# Patient Record
Sex: Female | Born: 1985 | Race: White | Hispanic: No | Smoking: Never smoker
Health system: Southern US, Community
[De-identification: ages and names within clinical notes are randomized; demographics above are authoritative.]

## PROBLEM LIST (undated history)

## (undated) DIAGNOSIS — A0472 Enterocolitis due to Clostridium difficile, not specified as recurrent: Secondary | ICD-10-CM

## (undated) DIAGNOSIS — Z22322 Carrier or suspected carrier of Methicillin resistant Staphylococcus aureus: Secondary | ICD-10-CM

## (undated) DIAGNOSIS — N289 Disorder of kidney and ureter, unspecified: Secondary | ICD-10-CM

## (undated) DIAGNOSIS — R630 Anorexia: Secondary | ICD-10-CM

## (undated) HISTORY — PX: KIDNEY SURGERY: SHX687

## (undated) HISTORY — PX: SKIN BIOPSY: SHX1

---

## 2006-05-10 ENCOUNTER — Encounter: Admission: RE | Admit: 2006-05-10 | Discharge: 2006-05-10 | Payer: Self-pay | Admitting: Nephrology

## 2007-01-27 ENCOUNTER — Encounter: Admission: RE | Admit: 2007-01-27 | Discharge: 2007-01-27 | Payer: Self-pay | Admitting: Orthopaedic Surgery

## 2008-05-19 IMAGING — CT CT EXTREM UP W/O CM*L*
1 of 4 series · 5 of 14 positions shown, 7 images · IV contrast (agent unspecified)
Comparison: none

CLINICAL DATA: Scaphoid pain.  Gymnast injury. 
 CT OF THE LEFT WRIST WITHOUT CONTRAST:
TECHNIQUE: Multidetector CT imaging was performed according to the standard protocol.  No intravenous contrast was administered.  Multiplanar CT image reconstructions were also generated.

[Series 4: recon 3: wrist · axial · 0.23mm/px · z∈[+63,+128]mm · 5 of 157 slices shown, 7 images]
[im 27/157  soft-tissue]
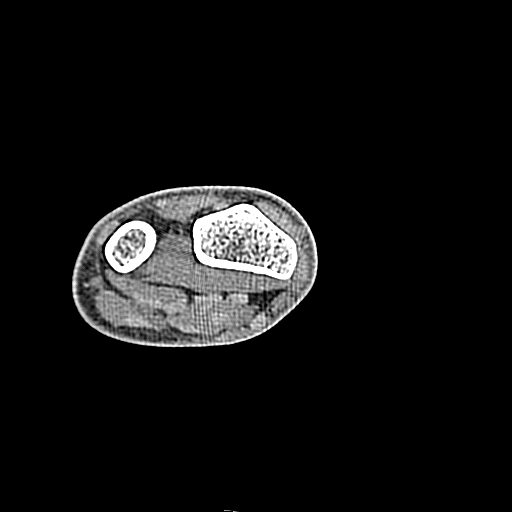
[im 27/157  bone]
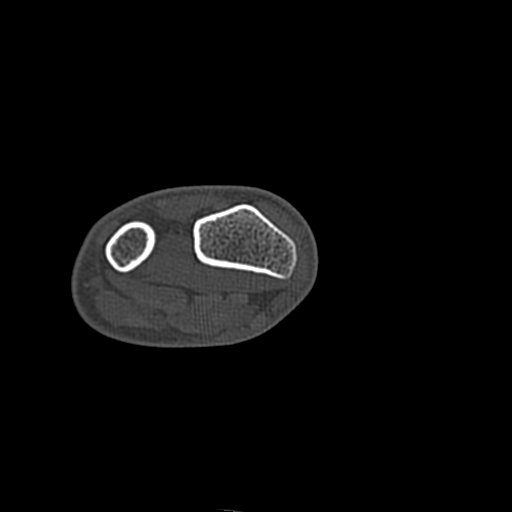
[im 53/157  bone]
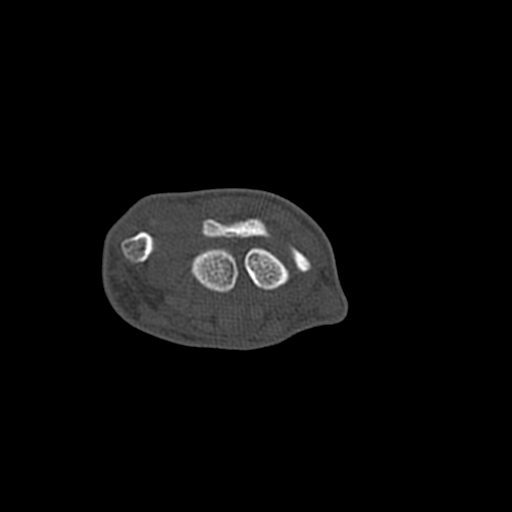
[im 79/157  bone]
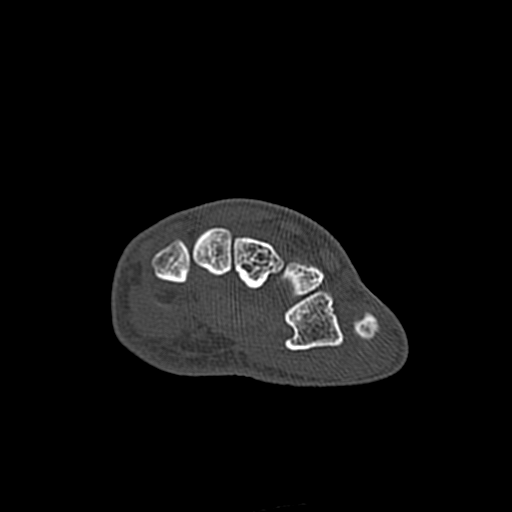
[im 105/157  bone]
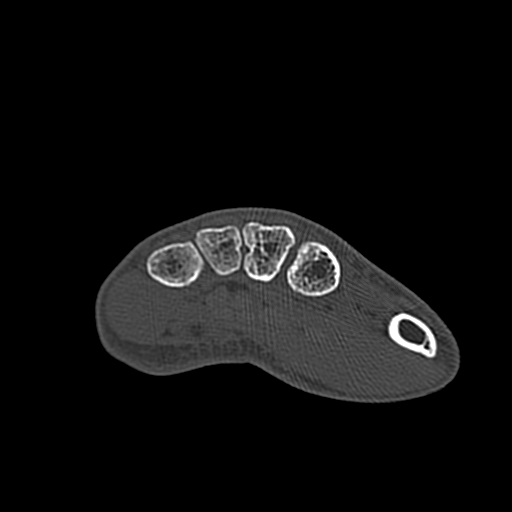
[im 131/157  soft-tissue]
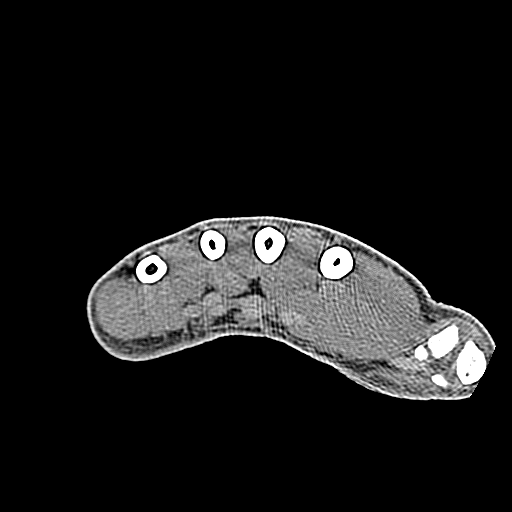
[im 131/157  bone]
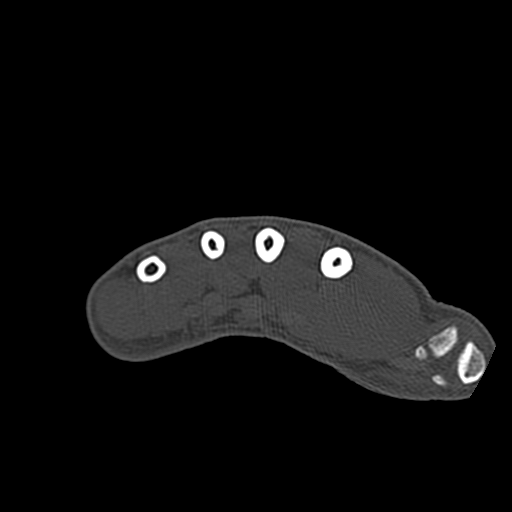

[5 of 14 positions shown; findings below may reference images not displayed]

FINDINGS: No acute fractures or dislocations are seen.  Specifically, there is no evidence of a scaphoid fracture.  There is no focal sclerosis of the scaphoid or lunate to suggest avascular necrosis.  Mild subcutaneous edema is present.  There is anatomic alignment of the bony frame work.
IMPRESSION: No evidence of bony injury.

## 2008-08-01 ENCOUNTER — Emergency Department (HOSPITAL_COMMUNITY): Admission: EM | Admit: 2008-08-01 | Discharge: 2008-08-01 | Payer: Self-pay | Admitting: Emergency Medicine

## 2009-11-22 IMAGING — CR DG CHEST 2V
2 series · 2 of 2 positions shown · non-contrast
Comparison: None

CLINICAL DATA: Fever, cough, vomiting

CHEST - 2 VIEW

[w chest pa]
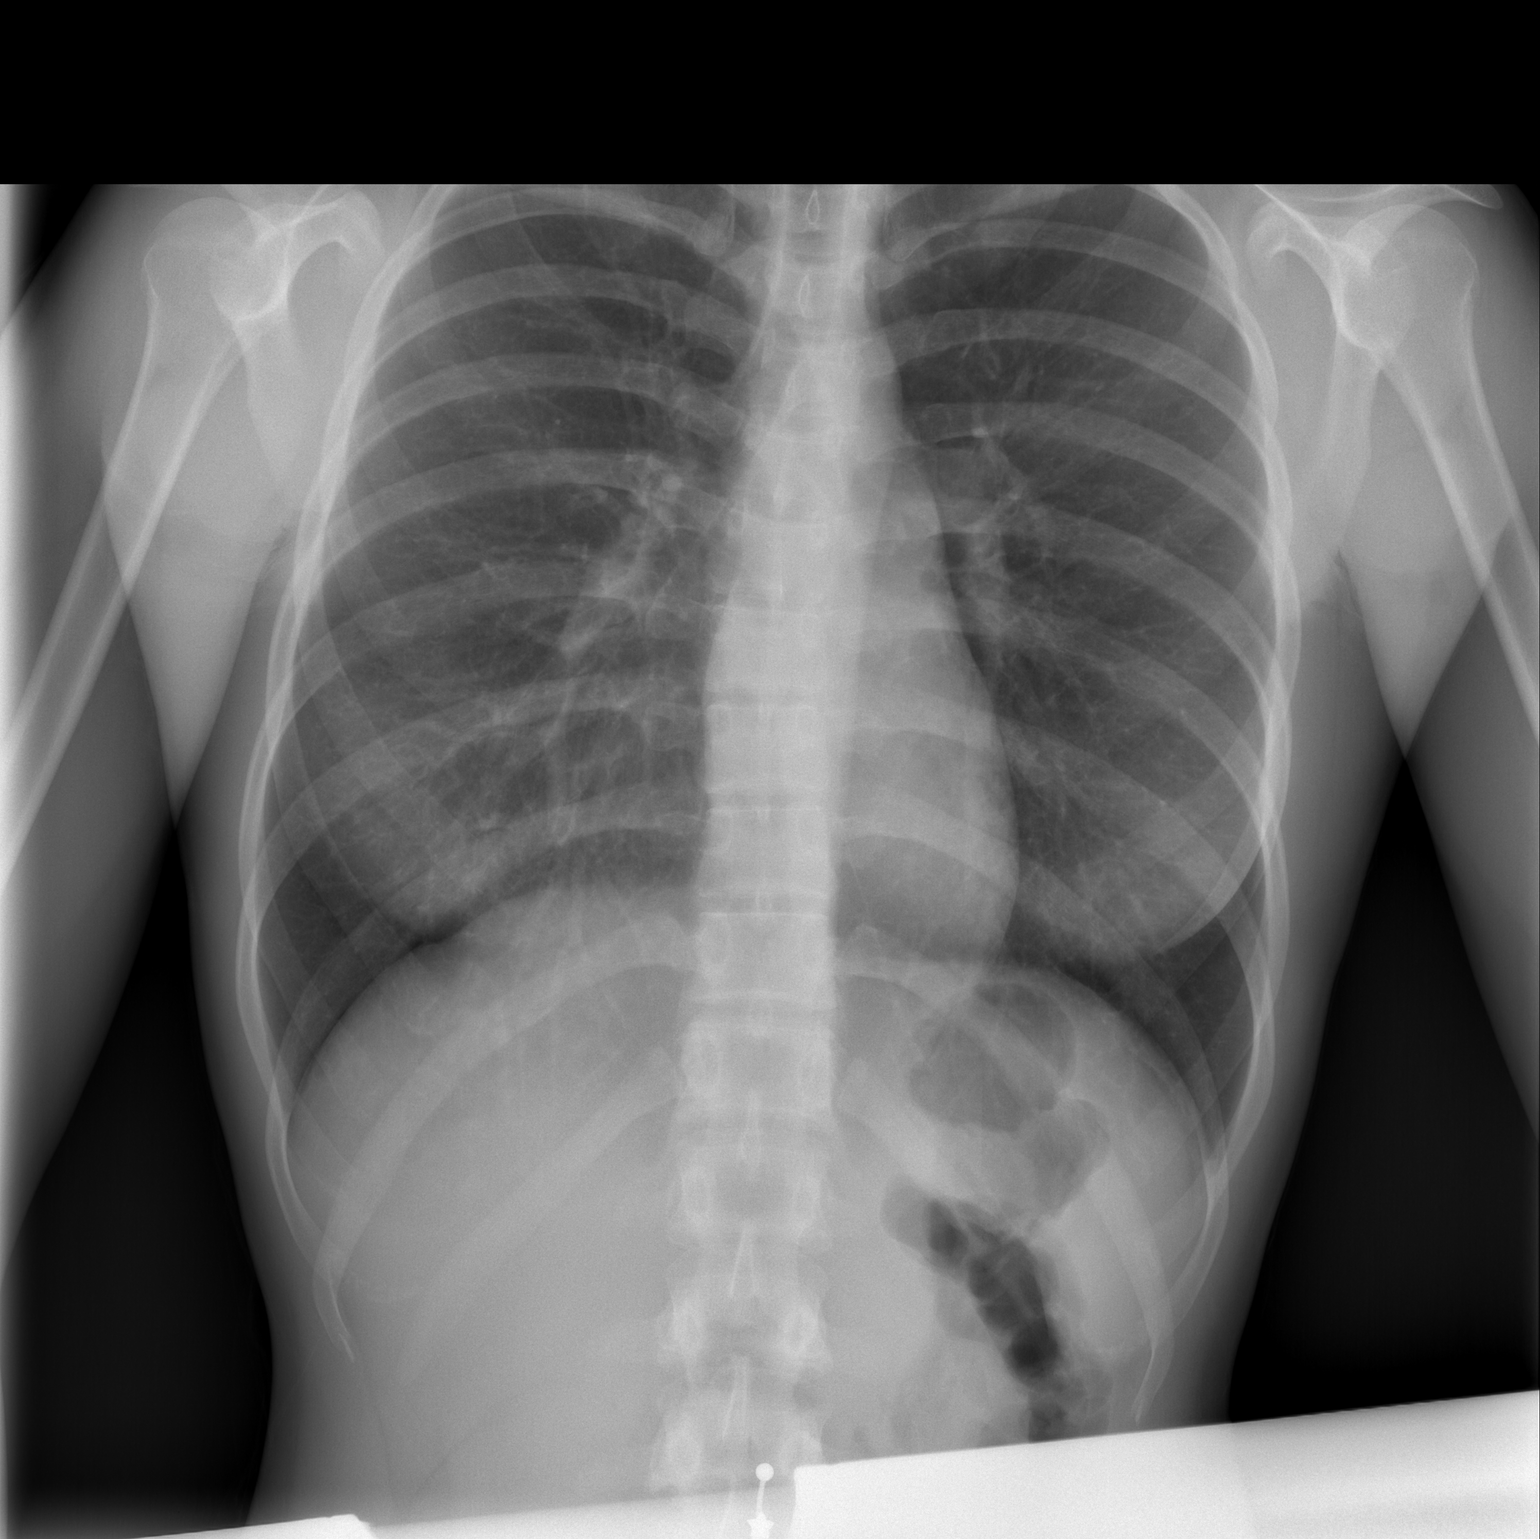

[w chest lat]
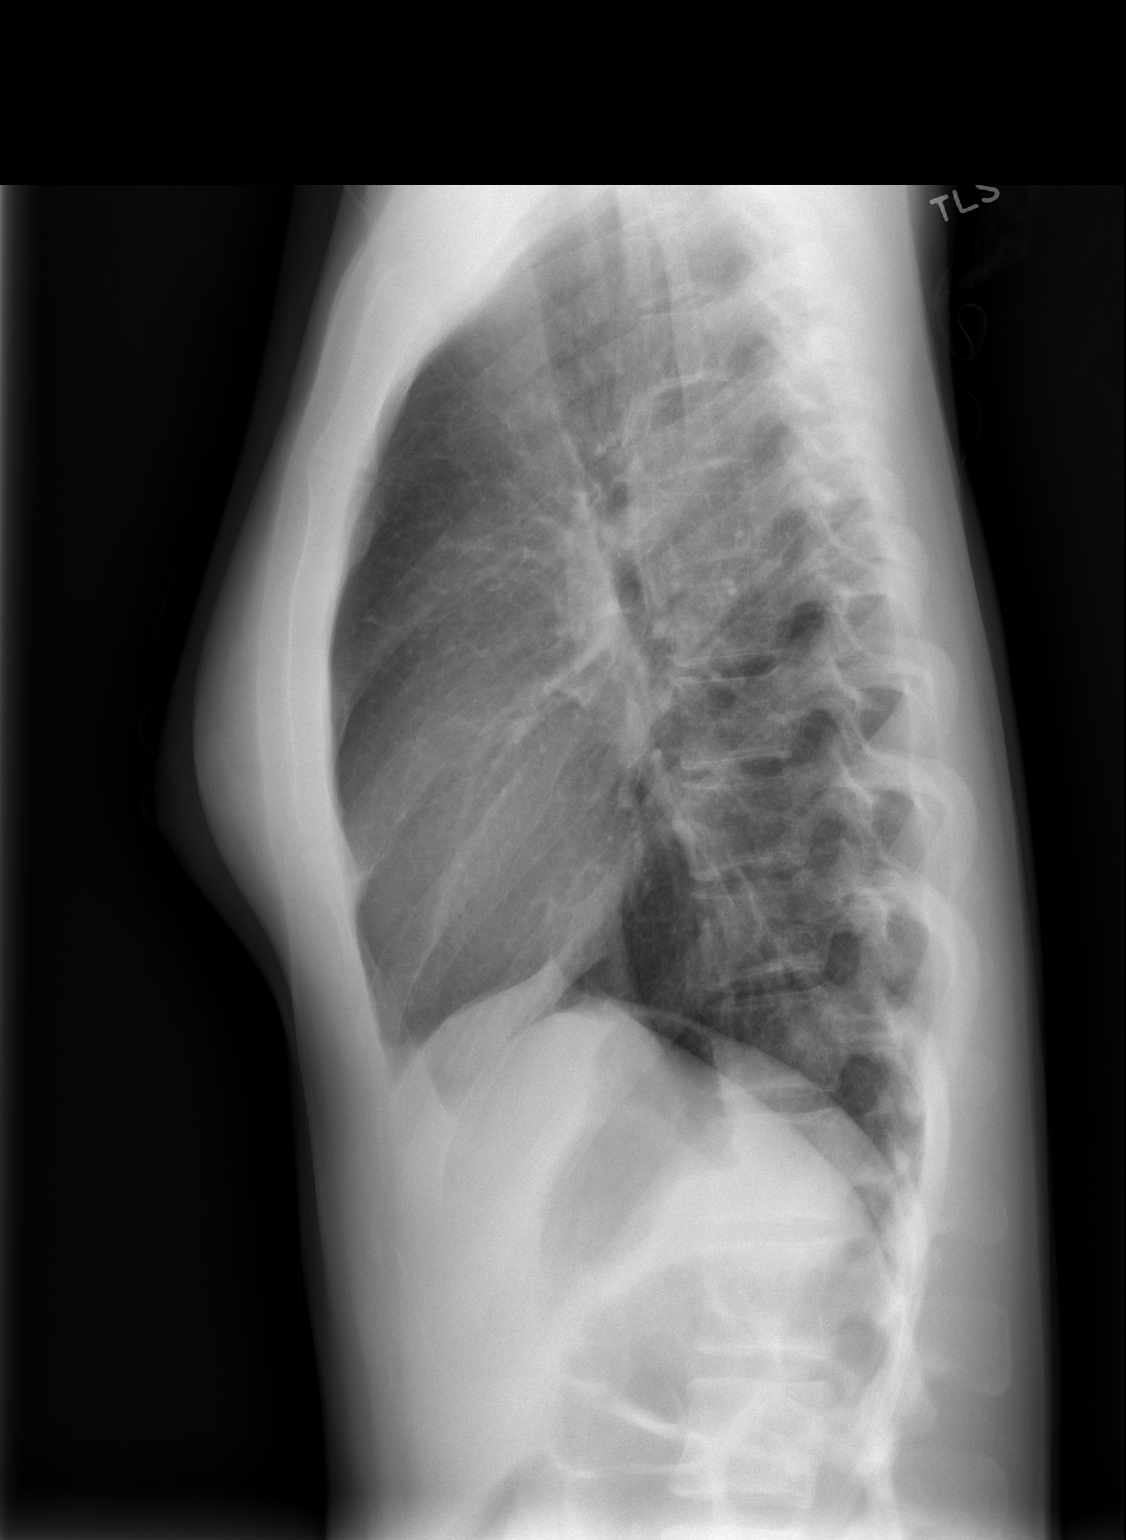

[2 of 2 positions shown; findings below may reference images not displayed]

FINDINGS: The heart size and mediastinal contours are within normal
limits.  Both lungs are clear.
IMPRESSION: No active disease.

## 2010-12-10 ENCOUNTER — Encounter: Payer: Self-pay | Admitting: Nephrology

## 2011-08-22 LAB — URINALYSIS, ROUTINE W REFLEX MICROSCOPIC
Specific Gravity, Urine: 1.02
pH: 6.5

## 2011-08-22 LAB — PREGNANCY, URINE: Preg Test, Ur: NEGATIVE

## 2011-08-22 LAB — RAPID STREP SCREEN (MED CTR MEBANE ONLY): Streptococcus, Group A Screen (Direct): NEGATIVE

## 2019-01-12 ENCOUNTER — Ambulatory Visit (HOSPITAL_COMMUNITY)
Admission: EM | Admit: 2019-01-12 | Discharge: 2019-01-12 | Disposition: A | Discharge status: Home / Self Care | Payer: BLUE CROSS/BLUE SHIELD | Attending: Family Medicine | Admitting: Family Medicine

## 2019-01-12 ENCOUNTER — Encounter (HOSPITAL_COMMUNITY): Payer: Self-pay

## 2019-01-12 DIAGNOSIS — R109 Unspecified abdominal pain: Secondary | ICD-10-CM | POA: Diagnosis not present

## 2019-01-12 DIAGNOSIS — N730 Acute parametritis and pelvic cellulitis: Secondary | ICD-10-CM

## 2019-01-12 DIAGNOSIS — R350 Frequency of micturition: Secondary | ICD-10-CM

## 2019-01-12 HISTORY — DX: Carrier or suspected carrier of methicillin resistant Staphylococcus aureus: Z22.322

## 2019-01-12 HISTORY — DX: Disorder of kidney and ureter, unspecified: N28.9

## 2019-01-12 HISTORY — DX: Anorexia: R63.0

## 2019-01-12 HISTORY — DX: Enterocolitis due to Clostridium difficile, not specified as recurrent: A04.72

## 2019-01-12 LAB — POCT URINALYSIS DIP (DEVICE)
BILIRUBIN URINE: NEGATIVE
Glucose, UA: NEGATIVE mg/dL
Ketones, ur: NEGATIVE mg/dL
LEUKOCYTE UA: NEGATIVE
NITRITE: NEGATIVE
Protein, ur: NEGATIVE mg/dL
Specific Gravity, Urine: 1.015 (ref 1.005–1.030)
Urobilinogen, UA: 0.2 mg/dL (ref 0.0–1.0)
pH: 7 (ref 5.0–8.0)

## 2019-01-12 MED ORDER — METRONIDAZOLE 500 MG PO TABS: 500.0000 mg | ORAL_TABLET | Freq: Two times a day (BID) | ORAL | 0 refills | Status: AC

## 2019-01-12 MED ORDER — AZITHROMYCIN 250 MG PO TABS
ORAL_TABLET | ORAL | Status: AC
  Filled 2019-01-12: qty 4

## 2019-01-12 MED ORDER — CEFTRIAXONE SODIUM 250 MG IJ SOLR
250.0000 mg | Freq: Once | INTRAMUSCULAR | Status: AC
  Administered 2019-01-12: 250 mg via INTRAMUSCULAR

## 2019-01-12 MED ORDER — DOXYCYCLINE HYCLATE 100 MG PO TABS: 100.0000 mg | ORAL_TABLET | Freq: Two times a day (BID) | ORAL | 0 refills | Status: AC

## 2019-01-12 MED ORDER — CEFTRIAXONE SODIUM 250 MG IJ SOLR
INTRAMUSCULAR | Status: AC
  Filled 2019-01-12: qty 250

## 2019-01-12 MED ORDER — AZITHROMYCIN 250 MG PO TABS
1000.0000 mg | ORAL_TABLET | Freq: Once | ORAL | Status: AC
  Administered 2019-01-12: 1000 mg via ORAL

## 2019-01-12 NOTE — ED Triage Notes (Signed)
Pt presents with complaints of suprapubic pain x 2 days. Reports kidney issues as a child. Pt also complains of bilateral flank pain. Pt also states she had a longer than usual period this past month.

## 2019-01-12 NOTE — ED Provider Notes (Signed)
[facility]    CSN: [id number] Arrival date & time: 01/12/19  1049     History   Chief Complaint Chief Complaint  Patient presents with  . Abdominal Pain    HPI Christina Wood is a 33 y.o. female.   33 yo woman with crampy abdominal pain for 2 weeks, much worse in past two days.  Pain worsened by voiding.  Able to sleep. Urinary frequency.  Associated with nausea and vomiting.  No fever.  LMP:  (irregular); spotting last two days.     Past Medical History:  Diagnosis Date  . Anorexia   . C. difficile colitis   . Kidney disease   . MRSA (methicillin resistant staph aureus) culture positive     There are no active problems to display for this patient.   Past Surgical History:  Procedure Laterality Date  . KIDNEY SURGERY    . SKIN BIOPSY      OB History   No obstetric history on file.      Home Medications    Prior to Admission medications   Medication Sig Start Date End Date Taking? Authorizing Provider  doxycycline (VIBRA-TABS) 100 MG tablet Take 1 tablet (100 mg total) by mouth 2 (two) times daily. 01/12/19   Elvina Sidle, MD  metroNIDAZOLE (FLAGYL) 500 MG tablet Take 1 tablet (500 mg total) by mouth 2 (two) times daily. 01/12/19   Elvina Sidle, MD    Family History Family History  Problem Relation Age of Onset  . Diabetes Mother   . Cancer Mother   . Hypertension Father     Social History Social History   Tobacco Use  . Smoking status: Never Smoker  . Smokeless tobacco: Current User  Substance Use Topics  . Alcohol use: Yes    Comment: occ  . Drug use: Never     Allergies   Macrodantin [nitrofurantoin]   Review of Systems Review of Systems   Physical Exam Triage Vital Signs ED Triage Vitals  Enc Vitals Group     BP 01/12/19 1201 (!) 151/101     Pulse Rate 01/12/19 1201 (!) 116     Resp 01/12/19 1201 20     Temp 01/12/19 1201 98.4 F (36.9 C)     Temp src --      SpO2 01/12/19 1201 100 %     Weight --       Height --      Head Circumference --      Peak Flow --      Pain Score 01/12/19 1200 8     Pain Loc --      Pain Edu? --      Excl. in GC? --    No data found.  Updated Vital Signs BP (!) 151/101   Pulse (!) 116   Temp 98.4 F (36.9 C)   Resp 20   LMP 01/12/2019   SpO2 100%    Physical Exam Vitals signs and nursing note reviewed.  Constitutional:      General: She is in acute distress.     Appearance: She is well-developed and normal weight.  HENT:     Head: Normocephalic.     Mouth/Throat:     Mouth: Mucous membranes are moist.  Eyes:     Extraocular Movements: Extraocular movements intact.  Cardiovascular:     Rate and Rhythm: Normal rate and regular rhythm.     Heart sounds: Normal heart sounds.  Pulmonary:  Effort: Pulmonary effort is normal.     Breath sounds: Normal breath sounds.  Abdominal:     General: Abdomen is flat. Bowel sounds are normal.     Tenderness: There is abdominal tenderness in the suprapubic area. There is guarding and rebound.  Skin:    General: Skin is warm.  Neurological:     General: No focal deficit present.     Mental Status: She is alert.  Psychiatric:        Mood and Affect: Mood normal.        Behavior: Behavior normal.      UC Treatments / Results  Labs (all labs ordered are listed, but only abnormal results are displayed) Labs Reviewed  POCT URINALYSIS DIP (DEVICE) - Abnormal; Notable for the following components:      Result Value   Hgb urine dipstick SMALL (*)    All other components within normal limits  URINE CULTURE  CERVICOVAGINAL ANCILLARY ONLY    EKG None  Radiology No results found.  Procedures Procedures (including critical care time)  Medications Ordered in UC Medications  azithromycin (ZITHROMAX) tablet 1,000 mg (has no administration in time range)  cefTRIAXone (ROCEPHIN) injection 250 mg (has no administration in time range)    Initial Impression / Assessment and Plan / UC Course    I have reviewed the triage vital signs and the nursing notes.  Pertinent labs & imaging results that were available during my care of the patient were reviewed by me and considered in my medical decision making (see chart for details).    Final Clinical Impressions(s) / UC Diagnoses   Final diagnoses:  PID (acute pelvic inflammatory disease)   Discharge Instructions   None    ED Prescriptions    Medication Sig Dispense Auth. Provider   doxycycline (VIBRA-TABS) 100 MG tablet Take 1 tablet (100 mg total) by mouth 2 (two) times daily. 20 tablet Elvina Sidle, MD   metroNIDAZOLE (FLAGYL) 500 MG tablet Take 1 tablet (500 mg total) by mouth 2 (two) times daily. 14 tablet Elvina Sidle, MD     Controlled Substance Prescriptions [place] Controlled Substance Registry consulted? Not Applicable   Elvina Sidle, MD 01/12/19 1234

## 2019-01-13 LAB — URINE CULTURE: Culture: <10000 — AB

## 2019-01-28 ENCOUNTER — Inpatient Hospital Stay (HOSPITAL_COMMUNITY)
Admission: AD | Admit: 2019-01-28 | Discharge: 2019-01-29 | Disposition: A | Discharge status: Home / Self Care | Payer: BLUE CROSS/BLUE SHIELD | Attending: Obstetrics and Gynecology | Admitting: Obstetrics and Gynecology

## 2019-01-28 ENCOUNTER — Other Ambulatory Visit: Payer: Self-pay

## 2019-01-28 DIAGNOSIS — R102 Pelvic and perineal pain: Secondary | ICD-10-CM | POA: Insufficient documentation

## 2019-01-28 DIAGNOSIS — Z3202 Encounter for pregnancy test, result negative: Secondary | ICD-10-CM | POA: Insufficient documentation

## 2019-01-28 LAB — URINALYSIS, ROUTINE W REFLEX MICROSCOPIC
BACTERIA UA: NONE SEEN
BILIRUBIN URINE: NEGATIVE
Glucose, UA: NEGATIVE mg/dL
HGB URINE DIPSTICK: NEGATIVE
KETONES UR: NEGATIVE mg/dL
Nitrite: NEGATIVE
Protein, ur: 30 mg/dL — AB
Specific Gravity, Urine: 1.019 (ref 1.005–1.030)
pH: 6 (ref 5.0–8.0)

## 2019-01-28 LAB — POCT PREGNANCY, URINE: PREG TEST UR: NEGATIVE

## 2019-01-28 NOTE — MAU Note (Signed)
Pt here for abdominal pain. Was sent here to be screened for PID/or endometriosis. -UPT.

## 2019-01-28 NOTE — MAU Provider Note (Addendum)
First Provider Initiated Contact with Patient 01/28/19 2052      S Ms. Christina Wood is a 33 y.o. No obstetric history on file. non-pregnant female who presents to MAU today with complaint of pelvic pain, recently treated for PID.   O BP 118/77 (BP Location: Right Arm)   Pulse 81   Resp 18   Ht 5\' 3"  (1.6 m)   Wt 50.5 kg   LMP 01/12/2019   SpO2 100%   BMI 19.74 kg/m  Physical Exam  Constitutional: She is oriented to person, place, and time. She appears well-developed and well-nourished. No distress.  HENT:  Head: Normocephalic and atraumatic.  Neck: Normal range of motion.  Cardiovascular: Normal rate.  Respiratory: Effort normal. No respiratory distress.  Musculoskeletal: Normal range of motion.  Neurological: She is alert and oriented to person, place, and time.  Psychiatric: She has a normal mood and affect.   A Non pregnant female Medical screening exam complete Pelvic pain Pregnancy test negative  P Transfer to ED Patient may return to MAU as needed for pregnancy related complaints  Donette Larry, [place] 01/28/2019 8:53 PM

## 2020-08-09 ENCOUNTER — Other Ambulatory Visit: Payer: BLUE CROSS/BLUE SHIELD

## 2022-07-13 ENCOUNTER — Encounter (HOSPITAL_COMMUNITY): Payer: Self-pay | Admitting: *Deleted

## 2022-07-13 ENCOUNTER — Emergency Department (HOSPITAL_COMMUNITY)
Admission: EM | Admit: 2022-07-13 | Discharge: 2022-07-14 | Discharge status: Against Medical Advice | Payer: BLUE CROSS/BLUE SHIELD | Attending: Emergency Medicine | Admitting: Emergency Medicine

## 2022-07-13 ENCOUNTER — Other Ambulatory Visit: Payer: Self-pay

## 2022-07-13 DIAGNOSIS — R112 Nausea with vomiting, unspecified: Secondary | ICD-10-CM | POA: Diagnosis not present

## 2022-07-13 DIAGNOSIS — R1084 Generalized abdominal pain: Secondary | ICD-10-CM | POA: Insufficient documentation

## 2022-07-13 DIAGNOSIS — Z5321 Procedure and treatment not carried out due to patient leaving prior to being seen by health care provider: Secondary | ICD-10-CM | POA: Diagnosis not present

## 2022-07-13 DIAGNOSIS — R197 Diarrhea, unspecified: Secondary | ICD-10-CM | POA: Insufficient documentation

## 2022-07-13 LAB — URINALYSIS, ROUTINE W REFLEX MICROSCOPIC
Bilirubin Urine: NEGATIVE
Glucose, UA: NEGATIVE mg/dL
Hgb urine dipstick: NEGATIVE
Ketones, ur: NEGATIVE mg/dL
Nitrite: NEGATIVE
Protein, ur: NEGATIVE mg/dL
Specific Gravity, Urine: 1.006 (ref 1.005–1.030)
pH: 7 (ref 5.0–8.0)

## 2022-07-13 LAB — COMPREHENSIVE METABOLIC PANEL
ALT: 18 U/L (ref 0–44)
AST: 23 U/L (ref 15–41)
Albumin: 4 g/dL (ref 3.5–5.0)
Alkaline Phosphatase: 42 U/L (ref 38–126)
Anion gap: 7 (ref 5–15)
BUN: 8 mg/dL (ref 6–20)
CO2: 25 mmol/L (ref 22–32)
Calcium: 9.1 mg/dL (ref 8.9–10.3)
Chloride: 103 mmol/L (ref 98–111)
Creatinine, Ser: 1.14 mg/dL — ABNORMAL HIGH (ref 0.44–1.00)
GFR, Estimated: >60 mL/min (ref >60)
Glucose, Bld: 89 mg/dL (ref 70–99)
Potassium: 3.4 mmol/L — ABNORMAL LOW (ref 3.5–5.1)
Sodium: 135 mmol/L (ref 135–145)
Total Bilirubin: 0.3 mg/dL (ref 0.3–1.2)
Total Protein: 6.7 g/dL (ref 6.5–8.1)

## 2022-07-13 LAB — CBC
HCT: 40.4 % (ref 36.0–46.0)
Hemoglobin: 13.9 g/dL (ref 12.0–15.0)
MCH: 33.1 pg (ref 26.0–34.0)
MCHC: 34.4 g/dL (ref 30.0–36.0)
MCV: 96.2 fL (ref 80.0–100.0)
Platelets: 229 10*3/uL (ref 150–400)
RBC: 4.2 MIL/uL (ref 3.87–5.11)
RDW: 12 % (ref 11.5–15.5)
WBC: 8.4 10*3/uL (ref 4.0–10.5)
nRBC: 0 % (ref 0.0–0.2)

## 2022-07-13 LAB — I-STAT BETA HCG BLOOD, ED (MC, WL, AP ONLY): I-stat hCG, quantitative: <5 m[IU]/mL (ref <5)

## 2022-07-13 LAB — LIPASE, BLOOD: Lipase: 43 U/L (ref 11–51)

## 2022-07-13 NOTE — ED Triage Notes (Signed)
Pt reports nausea for about a week, vomiting and diarrhea that started today. Generalized abdominal pain.

## 2022-07-14 NOTE — ED Notes (Signed)
PT did not answer for vitals 7:42am

## 2022-07-14 NOTE — ED Notes (Signed)
Called pt for vitals, no response.
